# Patient Record
Sex: Male | Born: 1935 | Race: Black or African American | Hispanic: No | Marital: Married | Smoking: Never smoker
Health system: Southern US, Community
[De-identification: ages and names within clinical notes are randomized; demographics above are authoritative.]

## PROBLEM LIST (undated history)

## (undated) DIAGNOSIS — I4891 Unspecified atrial fibrillation: Secondary | ICD-10-CM

## (undated) DIAGNOSIS — I10 Essential (primary) hypertension: Secondary | ICD-10-CM

## (undated) HISTORY — PX: SHOULDER SURGERY: SHX246

---

## 2017-06-23 ENCOUNTER — Encounter (HOSPITAL_BASED_OUTPATIENT_CLINIC_OR_DEPARTMENT_OTHER): Payer: Self-pay | Admitting: *Deleted

## 2017-06-23 ENCOUNTER — Emergency Department (HOSPITAL_BASED_OUTPATIENT_CLINIC_OR_DEPARTMENT_OTHER): Payer: Medicare Other

## 2017-06-23 ENCOUNTER — Observation Stay (HOSPITAL_BASED_OUTPATIENT_CLINIC_OR_DEPARTMENT_OTHER)
Admission: EM | Admit: 2017-06-23 | Discharge: 2017-06-24 | Disposition: A | Discharge status: Home / Self Care | Payer: Medicare Other | Attending: Internal Medicine | Admitting: Internal Medicine

## 2017-06-23 DIAGNOSIS — Z7901 Long term (current) use of anticoagulants: Secondary | ICD-10-CM | POA: Insufficient documentation

## 2017-06-23 DIAGNOSIS — I482 Chronic atrial fibrillation: Secondary | ICD-10-CM | POA: Insufficient documentation

## 2017-06-23 DIAGNOSIS — I10 Essential (primary) hypertension: Secondary | ICD-10-CM | POA: Diagnosis not present

## 2017-06-23 DIAGNOSIS — R001 Bradycardia, unspecified: Secondary | ICD-10-CM | POA: Diagnosis present

## 2017-06-23 DIAGNOSIS — Z79899 Other long term (current) drug therapy: Secondary | ICD-10-CM | POA: Diagnosis not present

## 2017-06-23 DIAGNOSIS — Z9889 Other specified postprocedural states: Secondary | ICD-10-CM | POA: Diagnosis not present

## 2017-06-23 HISTORY — DX: Essential (primary) hypertension: I10

## 2017-06-23 HISTORY — DX: Unspecified atrial fibrillation: I48.91

## 2017-06-23 LAB — COMPREHENSIVE METABOLIC PANEL
ALBUMIN: 3.4 g/dL — AB (ref 3.5–5.0)
ALK PHOS: 63 U/L (ref 38–126)
ALT: 15 U/L — ABNORMAL LOW (ref 17–63)
AST: 19 U/L (ref 15–41)
Anion gap: 10 (ref 5–15)
BUN: 28 mg/dL — ABNORMAL HIGH (ref 6–20)
CHLORIDE: 108 mmol/L (ref 101–111)
CO2: 22 mmol/L (ref 22–32)
Calcium: 8.4 mg/dL — ABNORMAL LOW (ref 8.9–10.3)
Creatinine, Ser: 1.72 mg/dL — ABNORMAL HIGH (ref 0.61–1.24)
GFR calc non Af Amer: 34 mL/min — ABNORMAL LOW (ref >60)
GFR, EST AFRICAN AMERICAN: 40 mL/min — AB (ref >60)
GLUCOSE: 103 mg/dL — AB (ref 65–99)
POTASSIUM: 4.3 mmol/L (ref 3.5–5.1)
SODIUM: 140 mmol/L (ref 135–145)
Total Bilirubin: 0.5 mg/dL (ref 0.3–1.2)
Total Protein: 6.4 g/dL — ABNORMAL LOW (ref 6.5–8.1)

## 2017-06-23 LAB — CBC WITH DIFFERENTIAL/PLATELET
BASOS PCT: 0 %
Basophils Absolute: 0 10*3/uL (ref 0.0–0.1)
EOS ABS: 0.2 10*3/uL (ref 0.0–0.7)
EOS PCT: 5 %
HCT: 34.9 % — ABNORMAL LOW (ref 39.0–52.0)
HEMOGLOBIN: 11.8 g/dL — AB (ref 13.0–17.0)
LYMPHS ABS: 1.4 10*3/uL (ref 0.7–4.0)
Lymphocytes Relative: 32 %
MCH: 29 pg (ref 26.0–34.0)
MCHC: 33.8 g/dL (ref 30.0–36.0)
MCV: 85.7 fL (ref 78.0–100.0)
MONOS PCT: 14 %
Monocytes Absolute: 0.6 10*3/uL (ref 0.1–1.0)
NEUTROS PCT: 49 %
Neutro Abs: 2.2 10*3/uL (ref 1.7–7.7)
PLATELETS: 191 10*3/uL (ref 150–400)
RBC: 4.07 MIL/uL — AB (ref 4.22–5.81)
RDW: 16.1 % — ABNORMAL HIGH (ref 11.5–15.5)
WBC: 4.5 10*3/uL (ref 4.0–10.5)

## 2017-06-23 LAB — BRAIN NATRIURETIC PEPTIDE: B Natriuretic Peptide: 100.3 pg/mL — ABNORMAL HIGH (ref 0.0–100.0)

## 2017-06-23 LAB — TROPONIN I

## 2017-06-23 LAB — PROTIME-INR
INR: 1.85
PROTHROMBIN TIME: 21.6 s — AB (ref 11.4–15.2)

## 2017-06-23 NOTE — ED Notes (Signed)
ED Provider at bedside. 

## 2017-06-23 NOTE — ED Provider Notes (Signed)
[facility] Provider Note   CSN: [id number] Arrival date & time: 06/23/17  1658     History   Chief Complaint Chief Complaint  Patient presents with  . Leg Swelling    HPI Peter Patel is a 83 y.o. male.  HPI   Patient is a 83 year old male who is on a road trip.  Patient has been on a road trip with his wife for the last 3 months.Hhe's been developing lower extremity swelling. When they were in [place] that went to Hospital and had an x-ray done and then was discharged. He reports the swelling got a little bit worse. Bilateral lower extremities. Went to urgent care today and was sent here for further evaluation. Urgent care also noted that he had a low heart rate. Patient does not have any dizziness shortness of breath chest pain or other complaints.  Past Medical History:  Diagnosis Date  . Atrial fibrillation (HCC)   . Hypertension     There are no active problems to display for this patient.   Past Surgical History:  Procedure Laterality Date  . SHOULDER SURGERY         Home Medications    Prior to Admission medications   Medication Sig Start Date End Date Taking? Authorizing Provider  AMIODARONE HCL PO Take by mouth.   Yes [provider]  amLODipine (NORVASC) 10 MG tablet Take 10 mg by mouth daily.   Yes [provider]  FINASTERIDE PO Take by mouth.   Yes [provider]  LISINOPRIL PO Take by mouth.   Yes [provider]  Metoprolol-Hydrochlorothiazide (METOPROLOL-HCTZ ER PO) Take by mouth.   Yes [provider]  RANITIDINE HCL PO Take by mouth.   Yes [provider]  warfarin (COUMADIN) 2 MG tablet Take 2 mg by mouth daily.   Yes [provider]    Family History No family history on file.  Social History Social History  Substance Use Topics  . Smoking status: Never Smoker  . Smokeless tobacco: Never Used  . Alcohol use No     Allergies   Patient has no allergy  information on record.   Review of Systems Review of Systems  Constitutional: Negative for activity change and fever.  HENT: Negative for sore throat.   Eyes: Negative for visual disturbance.  Respiratory: Negative for cough and shortness of breath.   Cardiovascular: Negative for chest pain.  Gastrointestinal: Negative for diarrhea, nausea and vomiting.  Genitourinary: Negative for dysuria and frequency.  Musculoskeletal: Negative for back pain.  Skin: Negative for rash.  Allergic/Immunologic: Negative for immunocompromised state.  Neurological: Negative for dizziness and light-headedness.  Psychiatric/Behavioral: Negative for confusion.  All other systems reviewed and are negative.    Physical Exam Updated Vital Signs BP (!) 156/79 (BP Location: Left Arm)   Pulse (!) 52   Temp 97.9 F (36.6 C) (Oral)   Resp 17   Ht 5' 5.75" (1.67 m)   Wt 67.1 kg (148 lb)   SpO2 99%   BMI 24.07 kg/m   Physical Exam  Constitutional: He is oriented to person, place, and time. He appears well-nourished.  HENT:  Head: Normocephalic.  Eyes: Conjunctivae are normal.  Cardiovascular: Normal rate.   Pulmonary/Chest: Effort normal.  Musculoskeletal:  +2 pitting edema bilateral lower extremities.  Neurological: He is oriented to person, place, and time.  Skin: Skin is warm and dry. He is not diaphoretic.  Psychiatric: He has a normal mood and affect. His  behavior is normal.     ED Treatments / Results  Labs (all labs ordered are listed, but only abnormal results are displayed) Labs Reviewed  CBC WITH DIFFERENTIAL/PLATELET - Abnormal; Notable for the following:       Result Value   RBC 4.07 (*)    Hemoglobin 11.8 (*)    HCT 34.9 (*)    RDW 16.1 (*)    All other components within normal limits  COMPREHENSIVE METABOLIC PANEL - Abnormal; Notable for the following:    Glucose, Bld 103 (*)    BUN 28 (*)    Creatinine, Ser 1.72 (*)    Calcium 8.4 (*)    Total Protein 6.4 (*)     Albumin 3.4 (*)    ALT 15 (*)    GFR calc non Af Amer 34 (*)    GFR calc Af Amer 40 (*)    All other components within normal limits  BRAIN NATRIURETIC PEPTIDE - Abnormal; Notable for the following:    B Natriuretic Peptide 100.3 (*)    All other components within normal limits  PROTIME-INR - Abnormal; Notable for the following:    Prothrombin Time 21.6 (*)    All other components within normal limits  TROPONIN I    EKG  EKG Interpretation  Date/Time:  Friday June 23 2017 17:31:22 EDT Ventricular Rate:  51 PR Interval:    QRS Duration: 133 QT Interval:  528 QTC Calculation: 487 R Axis:   -59 Text Interpretation:  Ectopic atrial rhythm Left bundle branch block slow rythym Confirmed by Corlis LeakMackuen, Ashlyn Cabler ([id number]) on 06/23/2017 6:30:50 PM       Radiology Dg Chest 2 View  Result Date: 06/23/2017 CLINICAL DATA:  Intermittent lightheadedness EXAM: CHEST  2 VIEW COMPARISON:  None. FINDINGS: No focal pulmonary infiltrate or effusion. Heart size upper normal. No pneumothorax. Degenerative osteophytes of the spine. IMPRESSION: No active cardiopulmonary disease. Electronically Signed   By: Jasmine PangKim  Fujinaga M.D.   On: 06/23/2017 19:47   [place] Venous Img Lower Bilateral  Result Date: 06/23/2017 CLINICAL DATA:  Bilateral lower extremity edema for 3 days EXAM: BILATERAL LOWER EXTREMITY VENOUS DOPPLER ULTRASOUND TECHNIQUE: Gray-scale sonography with graded compression, as well as color Doppler and duplex ultrasound were performed to evaluate the lower extremity deep venous systems from the level of the common femoral vein and including the common femoral, femoral, profunda femoral, popliteal and calf veins including the posterior tibial, peroneal and gastrocnemius veins when visible. The superficial great saphenous vein was also interrogated. Spectral Doppler was utilized to evaluate flow at rest and with distal augmentation maneuvers in the common femoral, femoral and popliteal veins. COMPARISON:  None.  FINDINGS: RIGHT LOWER EXTREMITY Common Femoral Vein: No evidence of thrombus. Normal compressibility, respiratory phasicity and response to augmentation. Saphenofemoral Junction: No evidence of thrombus. Normal compressibility and flow on color Doppler imaging. Profunda Femoral Vein: No evidence of thrombus. Normal compressibility and flow on color Doppler imaging. Femoral Vein: No evidence of thrombus. Normal compressibility, respiratory phasicity and response to augmentation. Popliteal Vein: No evidence of thrombus. Normal compressibility, respiratory phasicity and response to augmentation. Calf Veins: No evidence of thrombus. Normal compressibility and flow on color Doppler imaging. Superficial Great Saphenous Vein: No evidence of thrombus. Normal compressibility and flow on color Doppler imaging. Other Findings: Slightly complex fluid collection in the right popliteal fossa measuring 4 x 2.1 x 3.5 cm LEFT LOWER EXTREMITY Common Femoral Vein: No evidence of thrombus. Normal compressibility, respiratory phasicity and response to augmentation. Saphenofemoral Junction:  No evidence of thrombus. Normal compressibility and flow on color Doppler imaging. Profunda Femoral Vein: No evidence of thrombus. Normal compressibility and flow on color Doppler imaging. Femoral Vein: No evidence of thrombus. Normal compressibility, respiratory phasicity and response to augmentation. Popliteal Vein: No evidence of thrombus. Normal compressibility, respiratory phasicity and response to augmentation. Calf Veins: No evidence of thrombus. Normal compressibility and flow on color Doppler imaging. Superficial Great Saphenous Vein: No evidence of thrombus. Normal compressibility and flow on color Doppler imaging. Other Findings:  None. IMPRESSION: No evidence of DVT within either lower extremity. 4 cm slightly complex fluid collection in the popliteal fossa suggesting popliteal fossa cyst containing hemorrhage or debris. Electronically  Signed   By: Jasmine Pang M.D.   On: 06/23/2017 19:39    Procedures Procedures (including critical care time)  Medications Ordered in ED Medications - No data to display   Initial Impression / Assessment and Plan / ED Course  I have reviewed the triage vital signs and the nursing notes.  Pertinent labs & imaging results that were available during my care of the patient were reviewed by me and considered in my medical decision making (see chart for details).    Patient is an 83 year old male presenting with leg swelling during this [address] trip. Patient are evaluated with a chest x-ray in [place]. However this is month ago he's continued to have leg swelling. She has no shortness breath or chest pain. Patient did have low heart rate. However he is not having any symptoms. Patient's past medical history is significant for cardiac disease, A. fib, on Coumadin  Of note patient is on 40 mg of metoprolol ER daily.  8:49 PM DVT study is negative. BNP is elevated. Troponin negative. Given his swelling and elevated BNP I think patient likely has new-onset heart failure. I'm concerned also for patient's slow A. fib. Patient's heart rate is between 44 and 52. I would like to bring in for observation with stopping metoprolol to make sure patient's heart rate improves and get an echo to make sure patient is not in new failure needing medication changes.   Final Clinical Impressions(s) / ED Diagnoses   Final diagnoses:  None    New Prescriptions New Prescriptions   No medications on file     Abelino Derrick, MD 06/23/17 2049

## 2017-06-23 NOTE — ED Triage Notes (Signed)
His feet and legs have been swelling x 3 weeks.

## 2017-06-23 NOTE — ED Notes (Signed)
Patient denies pain and is resting comfortably.  

## 2017-06-24 ENCOUNTER — Encounter (HOSPITAL_COMMUNITY): Payer: Self-pay | Admitting: *Deleted

## 2017-06-24 DIAGNOSIS — R001 Bradycardia, unspecified: Secondary | ICD-10-CM | POA: Diagnosis not present

## 2017-06-24 LAB — CBC
HEMATOCRIT: 35.3 % — AB (ref 39.0–52.0)
HEMOGLOBIN: 11.7 g/dL — AB (ref 13.0–17.0)
MCH: 28.2 pg (ref 26.0–34.0)
MCHC: 33.1 g/dL (ref 30.0–36.0)
MCV: 85.1 fL (ref 78.0–100.0)
Platelets: 206 10*3/uL (ref 150–400)
RBC: 4.15 MIL/uL — ABNORMAL LOW (ref 4.22–5.81)
RDW: 16.2 % — ABNORMAL HIGH (ref 11.5–15.5)
WBC: 4 10*3/uL (ref 4.0–10.5)

## 2017-06-24 LAB — BASIC METABOLIC PANEL
ANION GAP: 7 (ref 5–15)
BUN: 21 mg/dL — ABNORMAL HIGH (ref 6–20)
CALCIUM: 8.7 mg/dL — AB (ref 8.9–10.3)
CHLORIDE: 110 mmol/L (ref 101–111)
CO2: 21 mmol/L — AB (ref 22–32)
Creatinine, Ser: 1.49 mg/dL — ABNORMAL HIGH (ref 0.61–1.24)
GFR calc non Af Amer: 41 mL/min — ABNORMAL LOW (ref >60)
GFR, EST AFRICAN AMERICAN: 48 mL/min — AB (ref >60)
GLUCOSE: 131 mg/dL — AB (ref 65–99)
POTASSIUM: 3.8 mmol/L (ref 3.5–5.1)
Sodium: 138 mmol/L (ref 135–145)

## 2017-06-24 LAB — TSH: TSH: 1.842 u[IU]/mL (ref 0.350–4.500)

## 2017-06-24 MED ORDER — DARIFENACIN HYDROBROMIDE ER 7.5 MG PO TB24
7.5000 mg | ORAL_TABLET | Freq: Every day | ORAL | Status: DC
  Administered 2017-06-24: 7.5 mg via ORAL
  Filled 2017-06-24: qty 1

## 2017-06-24 MED ORDER — FINASTERIDE 5 MG PO TABS
5.0000 mg | ORAL_TABLET | Freq: Every day | ORAL | Status: DC
  Administered 2017-06-24: 5 mg via ORAL
  Filled 2017-06-24: qty 1

## 2017-06-24 MED ORDER — WARFARIN - PHARMACIST DOSING INPATIENT: Freq: Every day | Status: DC

## 2017-06-24 MED ORDER — WARFARIN SODIUM 2 MG PO TABS: 1.0000 mg | ORAL_TABLET | Freq: Once | ORAL | Status: DC

## 2017-06-24 MED ORDER — FAMOTIDINE 20 MG PO TABS: 20.0000 mg | ORAL_TABLET | Freq: Two times a day (BID) | ORAL | Status: DC

## 2017-06-24 MED ORDER — ACETAMINOPHEN 325 MG PO TABS: 650.0000 mg | ORAL_TABLET | Freq: Four times a day (QID) | ORAL | Status: DC | PRN

## 2017-06-24 MED ORDER — AMLODIPINE BESYLATE 10 MG PO TABS
10.0000 mg | ORAL_TABLET | Freq: Every day | ORAL | Status: DC
  Administered 2017-06-24: 10 mg via ORAL
  Filled 2017-06-24: qty 1

## 2017-06-24 MED ORDER — AMIODARONE HCL 200 MG PO TABS
200.0000 mg | ORAL_TABLET | Freq: Every day | ORAL | Status: DC
  Filled 2017-06-24: qty 1

## 2017-06-24 MED ORDER — ONDANSETRON HCL 4 MG/2ML IJ SOLN: 4.0000 mg | Freq: Four times a day (QID) | INTRAMUSCULAR | Status: DC | PRN

## 2017-06-24 MED ORDER — ONDANSETRON HCL 4 MG PO TABS: 4.0000 mg | ORAL_TABLET | Freq: Four times a day (QID) | ORAL | Status: DC | PRN

## 2017-06-24 MED ORDER — FAMOTIDINE 20 MG PO TABS
20.0000 mg | ORAL_TABLET | Freq: Two times a day (BID) | ORAL | Status: DC
  Administered 2017-06-24: 20 mg via ORAL
  Filled 2017-06-24: qty 1

## 2017-06-24 MED ORDER — LISINOPRIL 40 MG PO TABS
40.0000 mg | ORAL_TABLET | Freq: Every day | ORAL | Status: DC
  Administered 2017-06-24: 40 mg via ORAL
  Filled 2017-06-24: qty 1

## 2017-06-24 MED ORDER — ACETAMINOPHEN 650 MG RE SUPP: 650.0000 mg | Freq: Four times a day (QID) | RECTAL | Status: DC | PRN

## 2017-06-24 MED ORDER — WARFARIN SODIUM 3 MG PO TABS
6.0000 mg | ORAL_TABLET | Freq: Once | ORAL | Status: AC
  Administered 2017-06-24: 6 mg via ORAL
  Filled 2017-06-24: qty 2

## 2017-06-24 NOTE — Progress Notes (Signed)
Reviewed discharge instructions with patient, wife and niece.  Went over medication changes.  Patient and wife verbalized understanding.  Patient ambulated to front of hospital to be taken home by wife and niece.

## 2017-06-24 NOTE — Discharge Instructions (Signed)
Bradycardia, Adult °Bradycardia is a slower-than-normal heartbeat. A normal resting heart rate for an adult ranges from 60 to 100 beats per minute. With bradycardia, the resting heart rate is less than 60 beats per minute. °Bradycardia can prevent enough oxygen from reaching certain areas of your body when you are active. It can be serious if it keeps enough oxygen from reaching your brain and other parts of your body. Bradycardia is not a problem for everyone. For some healthy adults, a slow resting heart rate is normal. °What are the causes? °This condition may be caused by: °· A problem with the heart, including: °? A problem with the heart's electrical system, such as a heart block. °? A problem with the heart's natural pacemaker (sinus node). °? Heart disease. °? A heart attack. °? Heart damage. °? A heart infection. °? A heart condition that is present at birth (congenital heart defect). °· Certain medicines that treat heart conditions. °· Certain conditions, such as hypothyroidism and obstructive sleep apnea. °· Problems with the balance of chemicals and other substances, like potassium, in the blood. ° °What increases the risk? °This condition is more likely to develop in adults who: °· Are age 65 or older. °· Have high blood pressure (hypertension), high cholesterol (hyperlipidemia), or diabetes. °· Drink heavily, use tobacco or nicotine products, or use drugs. °· Are stressed. ° °What are the signs or symptoms? °Symptoms of this condition include: °· Light-headedness. °· Feeling faint or fainting. °· Fatigue and weakness. °· Shortness of breath. °· Chest pain (angina). °· Drowsiness. °· Confusion. °· Dizziness. ° °How is this diagnosed? °This condition may be diagnosed based on: °· Your symptoms. °· Your medical history. °· A physical exam. ° °During the exam, your health care provider will listen to your heartbeat and check your pulse. To confirm the diagnosis, your health care provider may order tests,  such as: °· Blood tests. °· An electrocardiogram (ECG). This test records the heart's electrical activity. The test can show how fast your heart is beating and whether the heartbeat is steady. °· A test in which you wear a portable device (event recorder or Holter monitor) to record your heart's electrical activity while you go about your day. °· An exercise test. ° °How is this treated? °Treatment for this condition depends on the cause of the condition and how severe your symptoms are. Treatment may involve: °· Treatment of the underlying condition. °· Changing your medicines or how much medicine you take. °· Having a small, battery-operated device called a pacemaker implanted under the skin. When bradycardia occurs, this device can be used to increase your heart rate and help your heart to beat in a regular rhythm. ° °Follow these instructions at home: °Lifestyle ° °· Manage any health conditions that contribute to bradycardia as told by your health care provider. °· Follow a heart-healthy diet. A nutrition specialist (dietitian) can help to educate you about healthy food options and changes. °· Follow an exercise program that is approved by your health care provider. °· Maintain a healthy weight. °· Try to reduce or manage your stress, such as with yoga or meditation. If you need help reducing stress, ask your health care provider. °· Do not use use any products that contain nicotine or tobacco, such as cigarettes and e-cigarettes. If you need help quitting, ask your health care provider. °· Do not use illegal drugs. °· Limit alcohol intake to no more than 1 drink per day for nonpregnant women and 2 drinks per   day for men. One drink equals 12 oz of beer, 5 oz of wine, or 1½ oz of hard liquor. °General instructions °· Take over-the-counter and prescription medicines only as told by your health care provider. °· Keep all follow-up visits as directed by your health care provider. This is important. °How is this  prevented? °In some cases, bradycardia may be prevented by: °· Treating underlying medical problems. °· Stopping behaviors or medicines that can trigger the condition. ° °Contact a health care provider if: °· You feel light-headed or dizzy. °· You almost faint. °· You feel weak or are easily fatigued during physical activity. °· You experience confusion or have memory problems. °Get help right away if: °· You faint. °· You have an irregular heartbeat (palpitations). °· You have chest pain. °· You have trouble breathing. °This information is not intended to replace advice given to you by your health care provider. Make sure you discuss any questions you have with your health care provider. °Document Released: 08/20/2002 Document Revised: 07/26/2016 Document Reviewed: 05/19/2016 °Elsevier Interactive Patient Education © 2017 [employer] ° °

## 2017-06-24 NOTE — ED Notes (Addendum)
Attempted to call report [facility]nd time to [facility] Rm 3E[facility]; nurse unavailable and pt just left with [facility]; callback number left with secretary. ED [facility] charge nurse notified.

## 2017-06-24 NOTE — ED Notes (Signed)
Attempted to called report to [facility] Rm 3E[facility]; nurse unavailable. Left callback number and will attempt again when [facility] arrives.

## 2017-06-24 NOTE — Progress Notes (Signed)
Patient ambulated in hallway up to nurses station and back independently with RN as standby.  Patient denied any chest pain, shortness of breath or other complaint.

## 2017-06-24 NOTE — ED Notes (Addendum)
Report given to Marily MemosEdna, Charity fundraiserN at [facility].

## 2017-06-24 NOTE — H&P (Signed)
History and Physical    Peter StandingClarence Patel MVH:[id number] DOB: [birth year] DOA: 06/23/2017  PCP: System, Pcp Not In  Patient coming from: Home  I have personally briefly reviewed patient's old medical records [facility]  Chief Complaint: Leg swelling  HPI: Peter Patel is a 83 y.o. male with medical history significant of A.Fib and HTN.  Patient presented to his PCPs office today for c/o leg swelling that has been ongoing for several months.  No CP, no SOB.  Persistent symptoms.  [facility] and told to elevate his legs.  While at PCPs office he was noted to be in A.Fib (which is chronic) but was bradycardic to 40s-50s.  He got sent to ED.   ED Course: BNP 100.3.  CXR neg.  EDP didn't feel comfortable just decreasing his metoprolol so sent him for obs.   Review of Systems: As per HPI otherwise 10 point review of systems negative.   Past Medical History:  Diagnosis Date  . Atrial fibrillation (HCC)   . Hypertension     Past Surgical History:  Procedure Laterality Date  . SHOULDER SURGERY       reports that he has never smoked. He has never used smokeless tobacco. He reports that he does not drink alcohol or use drugs.  Not on File  No family history on file.   Prior to Admission medications   Medication Sig Start Date End Date Taking? Authorizing Provider  AMIODARONE HCL PO Take by mouth.   Yes [provider]  amLODipine (NORVASC) 10 MG tablet Take 10 mg by mouth daily.   Yes [provider]  FINASTERIDE PO Take by mouth.   Yes [provider]  LISINOPRIL PO Take by mouth.   Yes [provider]  Metoprolol-Hydrochlorothiazide (METOPROLOL-HCTZ ER PO) Take by mouth.   Yes [provider]  RANITIDINE HCL PO Take by mouth.   Yes [provider]  warfarin (COUMADIN) 2 MG tablet Take 2 mg by mouth daily.   Yes [provider]    Physical Exam: Vitals:   06/23/17 2330 06/24/17 0000 06/24/17  0030 06/24/17 0132  BP: (!) 146/100 (!) 146/73 (!) 156/75 (!) 161/78  Pulse: (!) 51   (!) 50  Resp: 14 (!) 22 14 16   Temp:    (!) 97.4 F (36.3 C)  TempSrc:    Oral  SpO2: 97%   100%  Weight:    63.5 kg (140 lb)  Height:    5\' 5"  (1.651 m)    Constitutional: NAD, calm, comfortable Eyes: PERRL, lids and conjunctivae normal ENMT: Mucous membranes are moist. Posterior pharynx clear of any exudate or lesions.Normal dentition.  Neck: normal, supple, no masses, no thyromegaly Respiratory: clear to auscultation bilaterally, no wheezing, no crackles. Normal respiratory effort. No accessory muscle use.  Cardiovascular: Regular rate and rhythm, no murmurs / rubs / gallops. No extremity edema. 2+ pedal pulses. No carotid bruits.  Abdomen: no tenderness, no masses palpated. No hepatosplenomegaly. Bowel sounds positive.  Musculoskeletal: no clubbing / cyanosis. No joint deformity upper and lower extremities. Good ROM, no contractures. Normal muscle tone.  Skin: no rashes, lesions, ulcers. No induration Neurologic: CN 2-12 grossly intact. Sensation intact, DTR normal. Strength 5/5 in all 4.  Psychiatric: Normal judgment and insight. Alert and oriented x 3. Normal mood.    Labs on Admission: I have personally reviewed following labs and imaging studies  CBC:  Recent Labs Lab 06/23/17 1839  WBC 4.5  NEUTROABS  2.2  HGB 11.8*  HCT 34.9*  MCV 85.7  PLT 191   Basic Metabolic Panel:  Recent Labs Lab 06/23/17 1839  NA 140  K 4.3  CL 108  CO2 22  GLUCOSE 103*  BUN 28*  CREATININE 1.72*  CALCIUM 8.4*   GFR: Estimated Creatinine Clearance: 27.3 mL/min (A) (by C-G formula based on SCr of 1.72 mg/dL (H)). Liver Function Tests:  Recent Labs Lab 06/23/17 1839  AST 19  ALT 15*  ALKPHOS 63  BILITOT 0.5  PROT 6.4*  ALBUMIN 3.4*   No results for input(s): LIPASE, AMYLASE in the last 168 hours. No results for input(s): AMMONIA in the last 168 hours. Coagulation Profile:  Recent  Labs Lab 06/23/17 1839  INR 1.85   Cardiac Enzymes:  Recent Labs Lab 06/23/17 1839  TROPONINI <0.03   BNP (last 3 results) No results for input(s): PROBNP in the last 8760 hours. HbA1C: No results for input(s): HGBA1C in the last 72 hours. CBG: No results for input(s): GLUCAP in the last 168 hours. Lipid Profile: No results for input(s): CHOL, HDL, LDLCALC, TRIG, CHOLHDL, LDLDIRECT in the last 72 hours. Thyroid Function Tests: No results for input(s): TSH, T4TOTAL, FREET4, T3FREE, THYROIDAB in the last 72 hours. Anemia Panel: No results for input(s): VITAMINB12, FOLATE, FERRITIN, TIBC, IRON, RETICCTPCT in the last 72 hours. Urine analysis: No results found for: COLORURINE, APPEARANCEUR, LABSPEC, PHURINE, GLUCOSEU, HGBUR, BILIRUBINUR, KETONESUR, PROTEINUR, UROBILINOGEN, NITRITE, LEUKOCYTESUR  Radiological Exams on Admission: Dg Chest 2 View  Result Date: 06/23/2017 CLINICAL DATA:  Intermittent lightheadedness EXAM: CHEST  2 VIEW COMPARISON:  None. FINDINGS: No focal pulmonary infiltrate or effusion. Heart size upper normal. No pneumothorax. Degenerative osteophytes of the spine. IMPRESSION: No active cardiopulmonary disease. Electronically Signed   By: Jasmine Pang M.D.   On: 06/23/2017 19:47   US Venous Img Lower Bilateral  Result Date: 06/23/2017 CLINICAL DATA:  Bilateral lower extremity edema for 3 days EXAM: BILATERAL LOWER EXTREMITY VENOUS DOPPLER ULTRASOUND TECHNIQUE: Gray-scale sonography with graded compression, as well as color Doppler and duplex ultrasound were performed to evaluate the lower extremity deep venous systems from the level of the common femoral vein and including the common femoral, femoral, profunda femoral, popliteal and calf veins including the posterior tibial, peroneal and gastrocnemius veins when visible. The superficial great saphenous vein was also interrogated. Spectral Doppler was utilized to evaluate flow at rest and with distal augmentation  maneuvers in the common femoral, femoral and popliteal veins. COMPARISON:  None. FINDINGS: RIGHT LOWER EXTREMITY Common Femoral Vein: No evidence of thrombus. Normal compressibility, respiratory phasicity and response to augmentation. Saphenofemoral Junction: No evidence of thrombus. Normal compressibility and flow on color Doppler imaging. Profunda Femoral Vein: No evidence of thrombus. Normal compressibility and flow on color Doppler imaging. Femoral Vein: No evidence of thrombus. Normal compressibility, respiratory phasicity and response to augmentation. Popliteal Vein: No evidence of thrombus. Normal compressibility, respiratory phasicity and response to augmentation. Calf Veins: No evidence of thrombus. Normal compressibility and flow on color Doppler imaging. Superficial Great Saphenous Vein: No evidence of thrombus. Normal compressibility and flow on color Doppler imaging. Other Findings: Slightly complex fluid collection in the right popliteal fossa measuring 4 x 2.1 x 3.5 cm LEFT LOWER EXTREMITY Common Femoral Vein: No evidence of thrombus. Normal compressibility, respiratory phasicity and response to augmentation. Saphenofemoral Junction: No evidence of thrombus. Normal compressibility and flow on color Doppler imaging. Profunda Femoral Vein: No evidence of thrombus. Normal compressibility and flow on color Doppler imaging. Femoral  Vein: No evidence of thrombus. Normal compressibility, respiratory phasicity and response to augmentation. Popliteal Vein: No evidence of thrombus. Normal compressibility, respiratory phasicity and response to augmentation. Calf Veins: No evidence of thrombus. Normal compressibility and flow on color Doppler imaging. Superficial Great Saphenous Vein: No evidence of thrombus. Normal compressibility and flow on color Doppler imaging. Other Findings:  None. IMPRESSION: No evidence of DVT within either lower extremity. 4 cm slightly complex fluid collection in the popliteal fossa  suggesting popliteal fossa cyst containing hemorrhage or debris. Electronically Signed   By: Jasmine Pang M.D.   On: 06/23/2017 19:39    EKG: Independently reviewed.  Assessment/Plan Active Problems:   Bradycardia    1. Bradycardia - 1. Decrease dose of metoprolol once we know what it is when he calls wife in AM 2. HTN - Continue other home meds  DVT prophylaxis: Coumadin Code Status: Full Family Communication: No family in room Disposition Plan: Home later today Consults called: None Admission status: Place in [place], [place]. DO [facility] Pager [phone]  If 7AM-7PM, please contact day team taking care of patient [email or web address] Password [email or web address]  06/24/2017, 3:14 AM

## 2017-06-24 NOTE — Progress Notes (Signed)
ANTICOAGULATION CONSULT NOTE - Initial Consult  Pharmacy Consult for Coumadin Indication: atrial fibrillation  Allergies  Allergen Reactions  . Other Swelling    Pt had a reaction to a medication that he was taking for fluid retention. He does not know the name of the medication.     Patient Measurements: Height: 5\' 5"  (165.1 cm) Weight: 140 lb (63.5 kg) IBW/kg (Calculated) : 61.5  Assessment: 83 yo M presents on 7/13 with leg swelling during a 3 month road trip. Patient received warfarin 6mg  at approximately 0900 this morning. INR last night in ED subtherapeutic 1.85, HgB 11.7, PLT WNL. Will order low dose warfarin tonight so that patient is back on standard warfarin dosing schedule.   Home warfarin: 4 mg on Sunday, Tuesday, Thursday Saturday. 2 mg all other days.  Goal of Therapy:  INR 2-3 Monitor platelets by anticoagulation protocol: Yes   Plan:  Warfarin 1mg  PO x1 tonight Monitor daily INR, CBC, s/s of bleed  Ruben Imony Faith Patricelli, PharmD Clinical Pharmacist 06/24/2017 12:12 PM

## 2017-06-24 NOTE — Progress Notes (Signed)
ANTICOAGULATION CONSULT NOTE - Initial Consult  Pharmacy Consult for Coumadin Indication: atrial fibrillation  Not on File  Patient Measurements: Height: 5\' 5"  (165.1 cm) Weight: 140 lb (63.5 kg) IBW/kg (Calculated) : 61.5  Assessment: 83 yo M presents on 7/13 with feel and leg swelling. On Coumadin 6mg  daily PTA for Afib. INR is slightly low at 1.85. Hgb 11.8, plts wnl.  Goal of Therapy:  INR 2-3 Monitor platelets by anticoagulation protocol: Yes   Plan:  Give Coumadin 6mg  PO daily, will give dose later this morning today Monitor daily INR, CBC, s/s of bleed  Enzo BiNathan Leianna Barga, PharmD, BCPS Clinical Pharmacist Pager [phone] 06/24/2017 2:40 AM

## 2017-06-24 NOTE — Discharge Summary (Signed)
[facility]  Physician Discharge Summary   Patient ID: Peter Patel MRN: [id number] DOB/AGE: 01-06-1930 83 y.o.  Admit date: 06/23/2017 Discharge date: 06/24/2017  PCP: At [place], [place]  DISCHARGE DIAGNOSES:  Active Problems:   Bradycardia   RECOMMENDATIONS FOR OUTPATIENT FOLLOW UP: 1. Patient instructed to follow-up providers at [place] and then at [place] which is his home town 2. He will need outpatient echocardiogram.   DISCHARGE CONDITION: good  Diet recommendation: As before  Filed Weights   06/23/17 1701 06/24/17 0132  Weight: 67.1 kg (148 lb) 63.5 kg (140 lb)    INITIAL HISTORY: 83 year old African-American male with a past medical history of atrial fibrillation on anticoagulation, hypertension, presented to urgent care with complaints of lower extremity edema that has been ongoing for several weeks but worse in the last 2-3 weeks. While he was at the urgent care center he was noted to have heart rate in the 40s. He was sent over to the emergency department.  Consultations:  None  Procedures:  None  HOSPITAL COURSE:   Bradycardia, possibly due to medications Patient noted to be on amiodarone and metoprolol at home. His metoprolol was held. Patient was never symptomatic due to the slow heart rate. Blood pressure remained stable. No symptoms suggestive of thyroid disease. Patient is originally from [place] and travels down to [place] every year. He then came to [place] to visit other family members. He does not know his usual heart rate. Have recommended that he stop his metoprolol for now. He may continue his amiodarone. He should have his vital signs checked by providers at [place] and then he should follow-up with his cardiologist in [place]. If heart rate has improved then a lower dose of metoprolol can be considered. This was discussed extensively with the patient and his wife. They're agreeable with this plan.  Bilateral lower  extremity edema. He took a road trip down from [place] several weeks ago. He had a lower extremity Doppler study which was negative for DVT. Small possible blood collection noted in the right popliteal area. He wears a knee brace, which could be causing that collection from trauma. He denies any pain. His edema has significantly improved overnight. BNP was only 100. Unlikely he has significant congestive heart failure. Edema could be due to venous insufficiency. Will not recommend any diuretics at this time. Compression stockings. He was asked to keep his leg elevated. He may benefit from an echocardiogram, but this can be pursued as outpatient. He will discuss this further with her providers in [place].  Blood work was repeated this morning and hemoglobin is stable. Creatinine is also stable to improved. Unknown if the patient has CKD.  He may continue anticoagulation for atrial fibrillation. INR to be checked at [facility]. His INR is subtherapeutic here.  Patient has been ambulating in the hallway without any difficulty. He is considered stable for discharge.    PERTINENT LABS:  The results of significant diagnostics from this hospitalization (including imaging, microbiology, ancillary and laboratory) are listed below for reference.      Labs: Basic Metabolic Panel:  Recent Labs Lab 06/23/17 1839 06/24/17 0910  NA 140 138  K 4.3 3.8  CL 108 110  CO2 22 21*  GLUCOSE 103* 131*  BUN 28* 21*  CREATININE 1.72* 1.49*  CALCIUM 8.4* 8.7*   Liver Function Tests:  Recent Labs Lab 06/23/17 1839  AST 19  ALT 15*  ALKPHOS 63  BILITOT 0.5  PROT 6.4*  ALBUMIN 3.4*  CBC:  Recent Labs Lab 06/23/17 1839 06/24/17 0910  WBC 4.5 4.0  NEUTROABS 2.2  --   HGB 11.8* 11.7*  HCT 34.9* 35.3*  MCV 85.7 85.1  PLT 191 206   Cardiac Enzymes:  Recent Labs Lab 06/23/17 1839  TROPONINI <0.03   BNP: BNP (last 3 results)  Recent Labs  06/23/17 1839  BNP 100.3*    IMAGING  STUDIES Dg Chest 2 View  Result Date: 06/23/2017 CLINICAL DATA:  Intermittent lightheadedness EXAM: CHEST  2 VIEW COMPARISON:  None. FINDINGS: No focal pulmonary infiltrate or effusion. Heart size upper normal. No pneumothorax. Degenerative osteophytes of the spine. IMPRESSION: No active cardiopulmonary disease. Electronically Signed   By: Jasmine Pang M.D.   On: 06/23/2017 19:47   US Venous Img Lower Bilateral  Result Date: 06/23/2017 CLINICAL DATA:  Bilateral lower extremity edema for 3 days EXAM: BILATERAL LOWER EXTREMITY VENOUS DOPPLER ULTRASOUND TECHNIQUE: Gray-scale sonography with graded compression, as well as color Doppler and duplex ultrasound were performed to evaluate the lower extremity deep venous systems from the level of the common femoral vein and including the common femoral, femoral, profunda femoral, popliteal and calf veins including the posterior tibial, peroneal and gastrocnemius veins when visible. The superficial great saphenous vein was also interrogated. Spectral Doppler was utilized to evaluate flow at rest and with distal augmentation maneuvers in the common femoral, femoral and popliteal veins. COMPARISON:  None. FINDINGS: RIGHT LOWER EXTREMITY Common Femoral Vein: No evidence of thrombus. Normal compressibility, respiratory phasicity and response to augmentation. Saphenofemoral Junction: No evidence of thrombus. Normal compressibility and flow on color Doppler imaging. Profunda Femoral Vein: No evidence of thrombus. Normal compressibility and flow on color Doppler imaging. Femoral Vein: No evidence of thrombus. Normal compressibility, respiratory phasicity and response to augmentation. Popliteal Vein: No evidence of thrombus. Normal compressibility, respiratory phasicity and response to augmentation. Calf Veins: No evidence of thrombus. Normal compressibility and flow on color Doppler imaging. Superficial Great Saphenous Vein: No evidence of thrombus. Normal compressibility and  flow on color Doppler imaging. Other Findings: Slightly complex fluid collection in the right popliteal fossa measuring 4 x 2.1 x 3.5 cm LEFT LOWER EXTREMITY Common Femoral Vein: No evidence of thrombus. Normal compressibility, respiratory phasicity and response to augmentation. Saphenofemoral Junction: No evidence of thrombus. Normal compressibility and flow on color Doppler imaging. Profunda Femoral Vein: No evidence of thrombus. Normal compressibility and flow on color Doppler imaging. Femoral Vein: No evidence of thrombus. Normal compressibility, respiratory phasicity and response to augmentation. Popliteal Vein: No evidence of thrombus. Normal compressibility, respiratory phasicity and response to augmentation. Calf Veins: No evidence of thrombus. Normal compressibility and flow on color Doppler imaging. Superficial Great Saphenous Vein: No evidence of thrombus. Normal compressibility and flow on color Doppler imaging. Other Findings:  None. IMPRESSION: No evidence of DVT within either lower extremity. 4 cm slightly complex fluid collection in the popliteal fossa suggesting popliteal fossa cyst containing hemorrhage or debris. Electronically Signed   By: Jasmine Pang M.D.   On: 06/23/2017 19:39    DISCHARGE EXAMINATION: Vitals:   06/24/17 0132 06/24/17 0508 06/24/17 1028 06/24/17 1216  BP: (!) 161/78 140/65 127/63 138/67  Pulse: (!) 50 (!) 53 (!) 53 (!) 49  Resp: 16 16  18   Temp: (!) 97.4 F (36.3 C) 97.7 F (36.5 C)  98.1 F (36.7 C)  TempSrc: Oral Oral  Oral  SpO2: 100% 93% 99% 100%  Weight: 63.5 kg (140 lb)     Height: 5\' 5"  (1.651 m)  General appearance: alert, cooperative, appears stated age and no distress Resp: clear to auscultation bilaterally Cardio: S1, S2, is bradycardic, regular. No S3, S4. No rubs, or bruit. Systolic murmur appreciated over the precordium. GI: soft, non-tender; bowel sounds normal; no masses,  no organomegaly  DISPOSITION: Home  Discharge Instructions     Call MD for:  difficulty breathing, headache or visual disturbances    Complete by:  As directed    Call MD for:  extreme fatigue    Complete by:  As directed    Call MD for:  persistant dizziness or light-headedness    Complete by:  As directed    Call MD for:  persistant nausea and vomiting    Complete by:  As directed    Call MD for:  severe uncontrolled pain    Complete by:  As directed    Call MD for:  temperature >100.4    Complete by:  As directed    Diet - low sodium heart healthy    Complete by:  As directed    Discharge instructions    Complete by:  As directed    Please do not take your metoprolol for now. It is unclear if your slow heart rate is a new issue for you or if you always had a slow heart rate. Please follow-up with your providers in [place] in the next few days and if heart rate has improved, then a lower dose of metoprolol could be considered. You will also benefit from having an echocardiogram to do further workup for your leg swelling. This can be pursued in the next several weeks. Please have your PT/INR checked on Tuesday, July 17. [facility]. Please be sure to follow-up with your cardiologist in [place] when you return there.  You were cared for by a hospitalist during your hospital stay. If you have any questions about your discharge medications or the care you received while you were in the hospital after you are discharged, you can call the unit and asked to speak with the hospitalist on call if the hospitalist that took care of you is not available. Once you are discharged, your primary care physician will handle any further medical issues. Please note that NO REFILLS for any discharge medications will be authorized once you are discharged, as it is imperative that you return to your primary care physician (or establish a relationship with a primary care physician if you do not have one) for your aftercare needs so that  they can reassess your need for medications and monitor your lab values. If you do not have a primary care physician, you can call [phone] for a physician referral.   Increase activity slowly    Complete by:  As directed       ALLERGIES:  Allergies  Allergen Reactions  . Other Swelling    Pt had a reaction to a medication that he was taking for fluid retention. He does not know the name of the medication.      Discharge Medication List as of 06/24/2017  1:03 PM    CONTINUE these medications which have NOT CHANGED   Details  AMIODARONE HCL PO Take 200 mg by mouth daily. , Historical Med    amLODipine (NORVASC) 10 MG tablet Take 10 mg by mouth daily., Historical Med    FINASTERIDE PO Take 5 mg by mouth daily. , Historical Med    LISINOPRIL PO Take 40 mg by  mouth daily. , Historical Med    RANITIDINE HCL PO Take by mouth., Historical Med    warfarin (COUMADIN) 2 MG tablet Take by mouth daily. 4 mg on Sunday, Tuesday, Thursday Saturday. 2 mg all other days., Historical Med      STOP taking these medications     metoprolol tartrate (LOPRESSOR) 50 MG tablet           TOTAL DISCHARGE TIME: 35 mins  [facility]  [facility] Pager [phone]  06/24/2017, 3:15 PM

## 2018-07-14 IMAGING — CR DG CHEST 2V
2 series · 2 of 2 positions shown · non-contrast
Comparison: None.

CLINICAL DATA: Intermittent lightheadedness

EXAM:
CHEST  2 VIEW

[w chest pa]
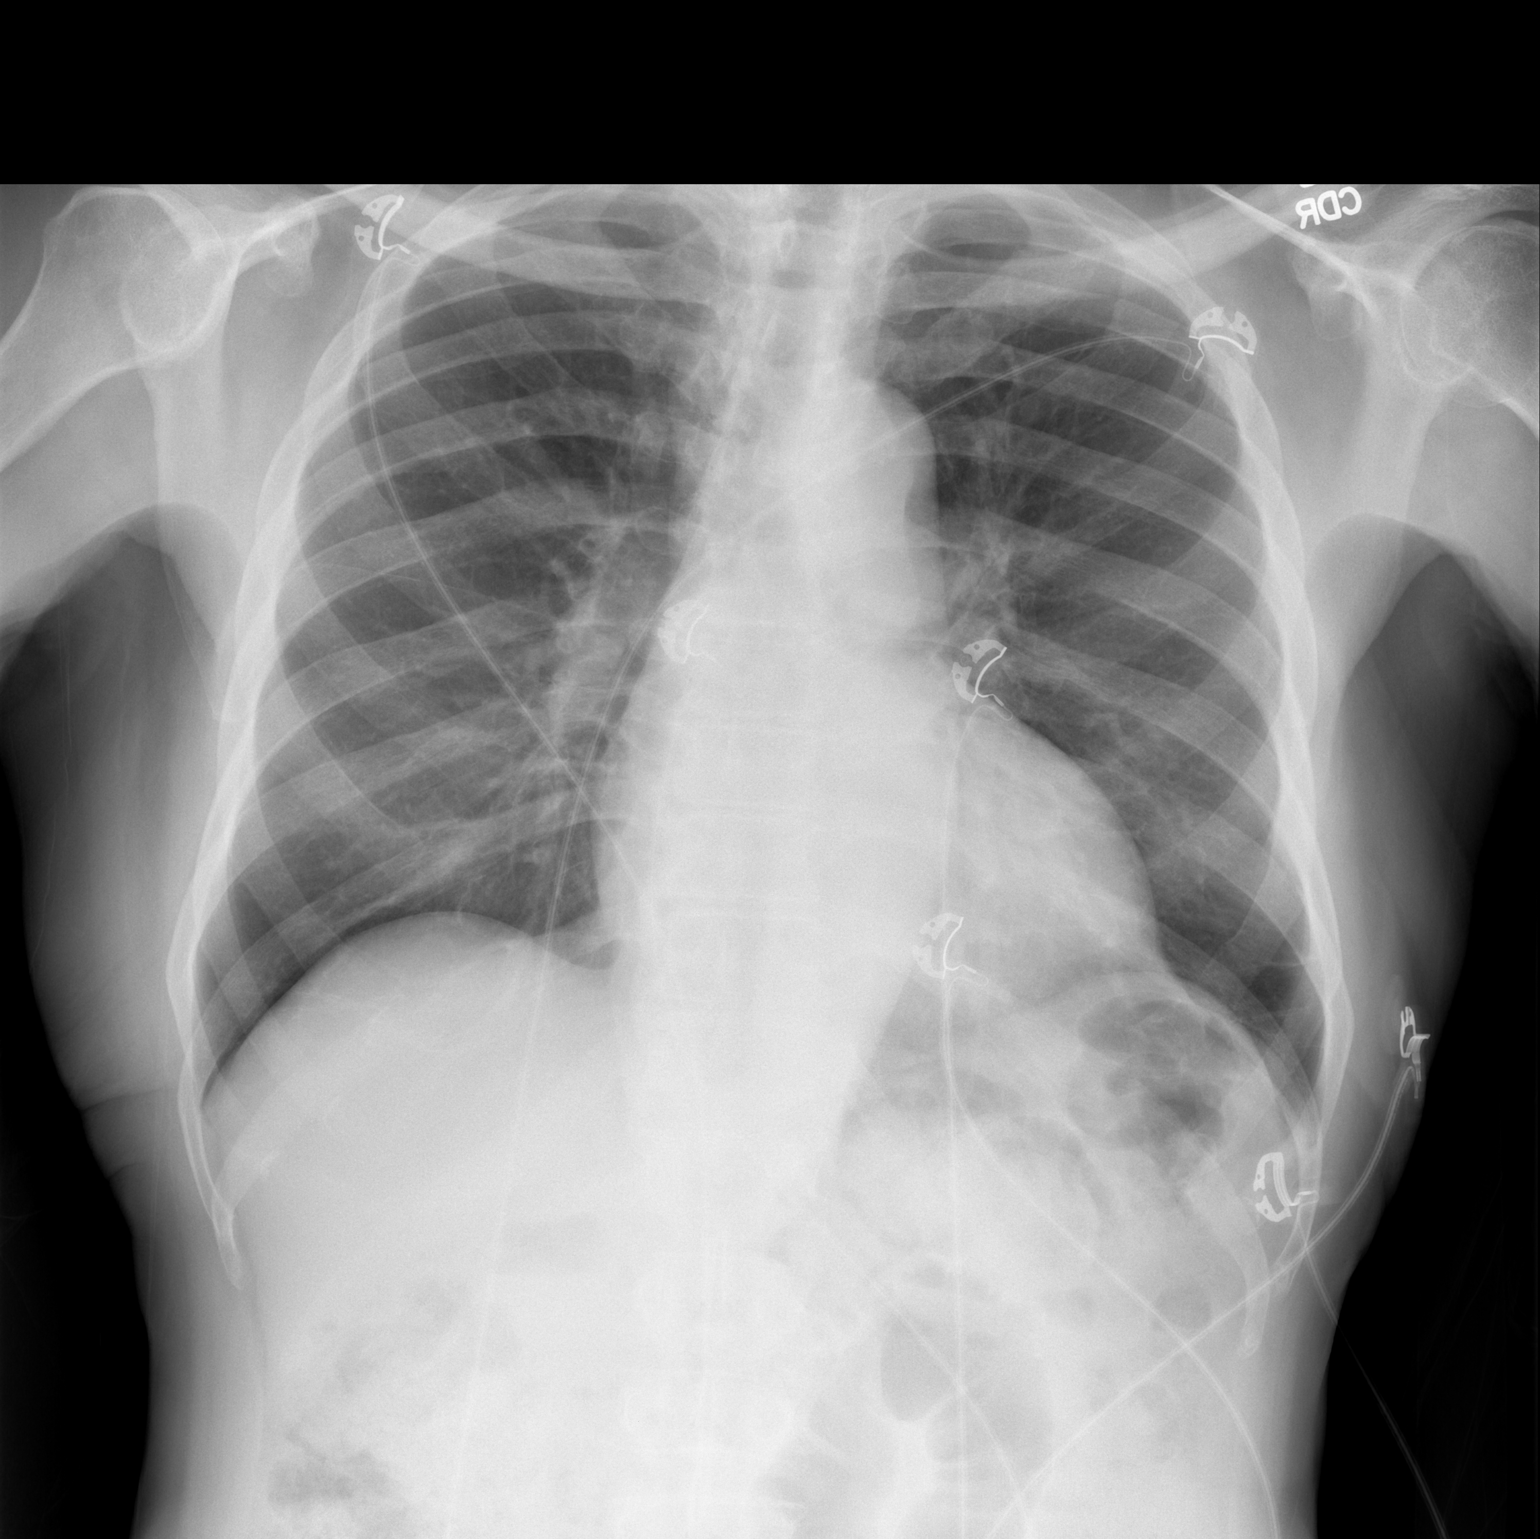

[w chest lat]
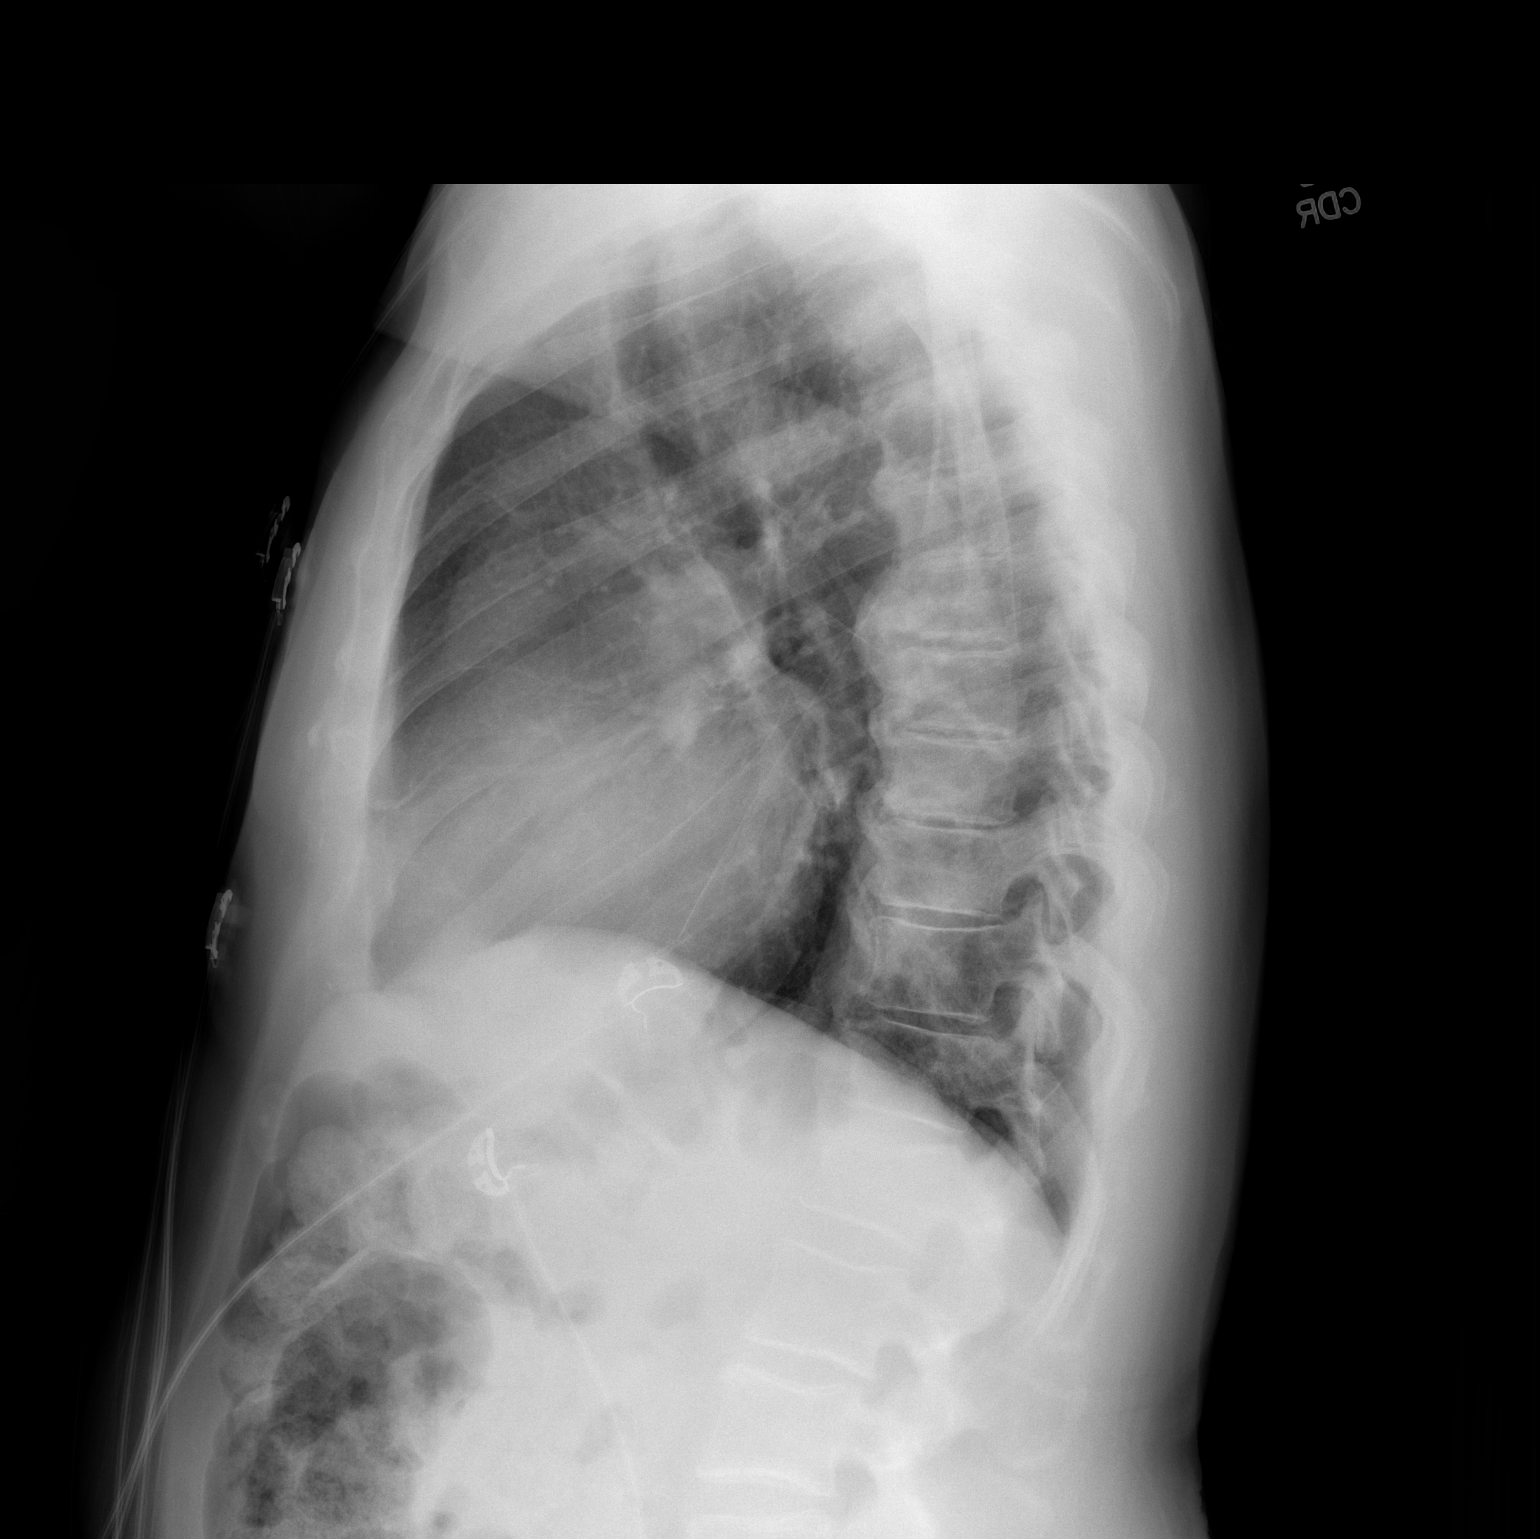

[2 of 2 positions shown; findings below may reference images not displayed]

FINDINGS: No focal pulmonary infiltrate or effusion. Heart size upper normal.
No pneumothorax. Degenerative osteophytes of the spine.
IMPRESSION: No active cardiopulmonary disease.

## 2018-12-11 IMAGING — US US EXTREM LOW VENOUS BILAT
1 series · 13 of 24 positions shown · non-contrast
Comparison: None.

CLINICAL DATA: Bilateral lower extremity edema for 3 days



[Series 1: us extrem low venous bilat · 0.08mm/px · 13 of 55 slices shown]
[im 1/55]
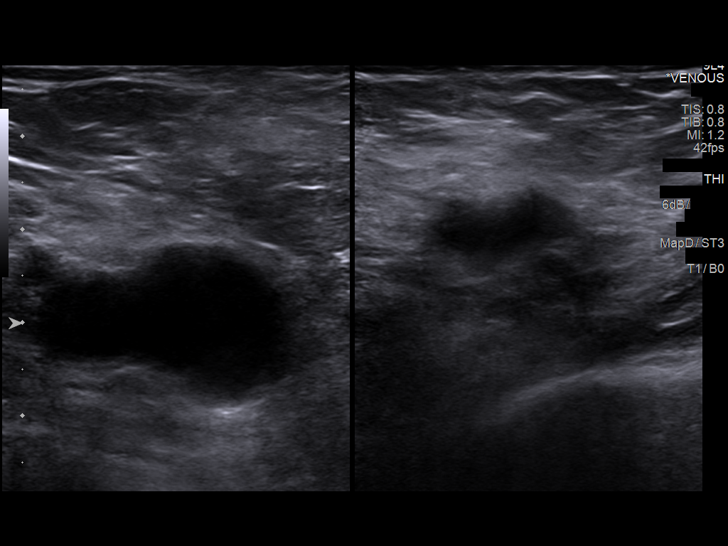
[im 5/55]
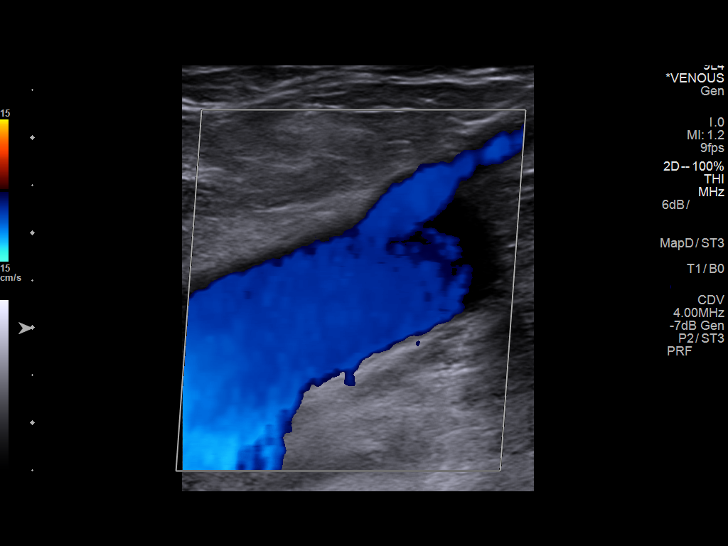
[im 10/55]
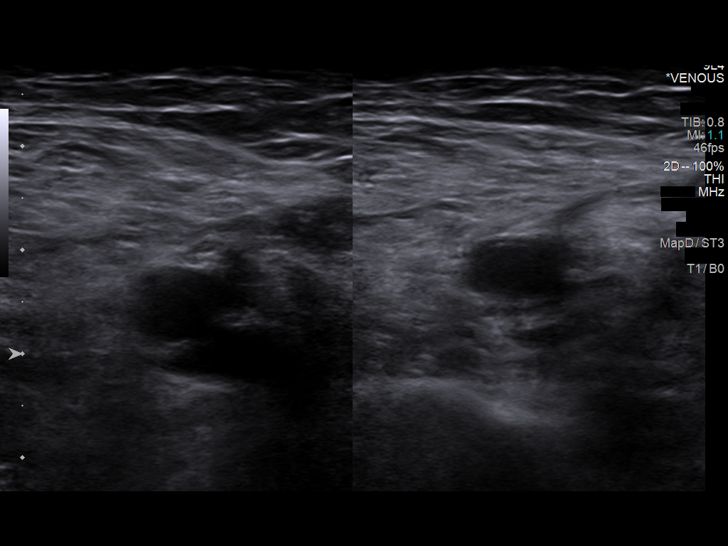
[im 15/55]
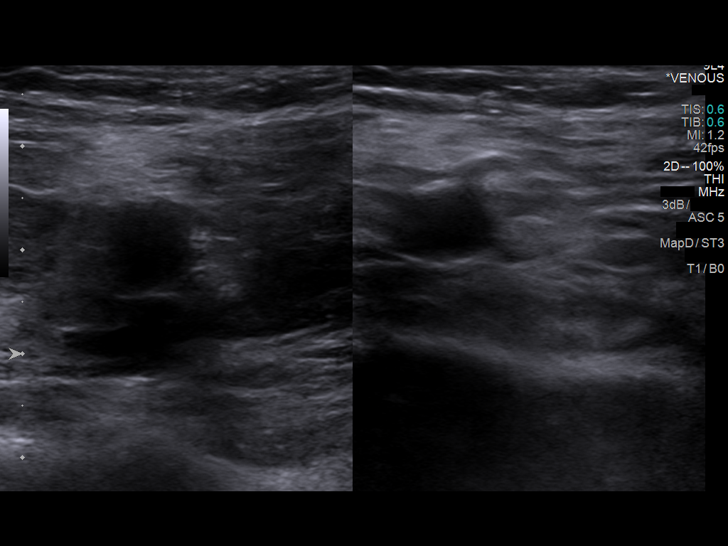
[im 19/55]
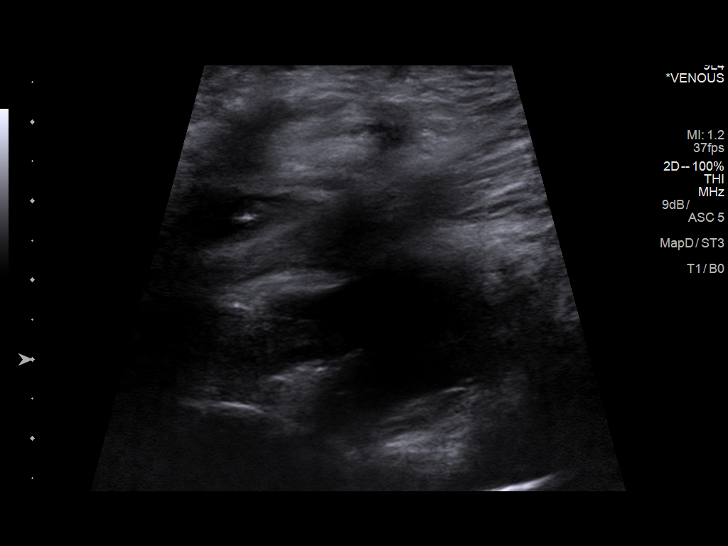
[im 24/55]
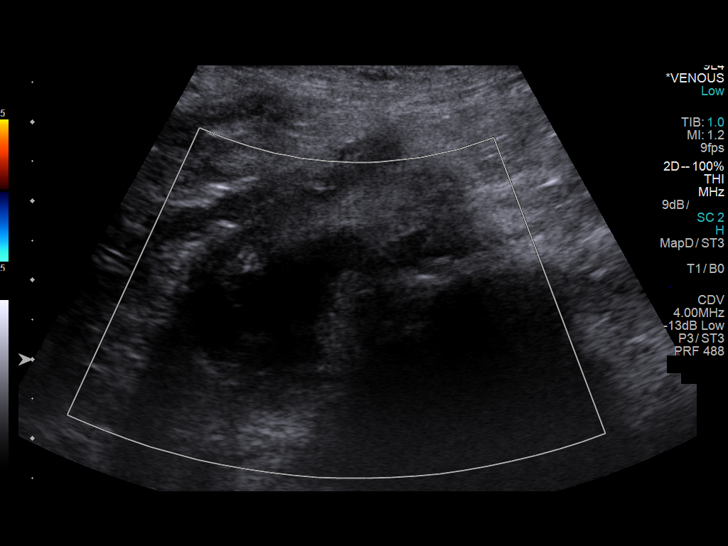
[im 29/55]
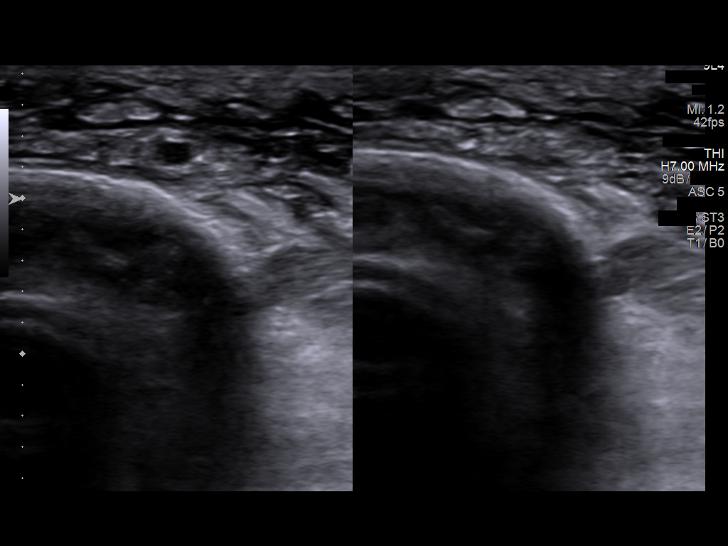
[im 31/55]
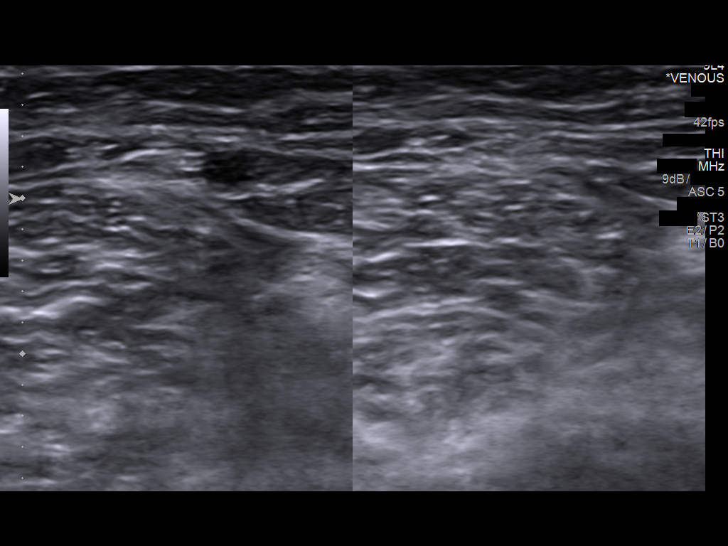
[im 36/55]
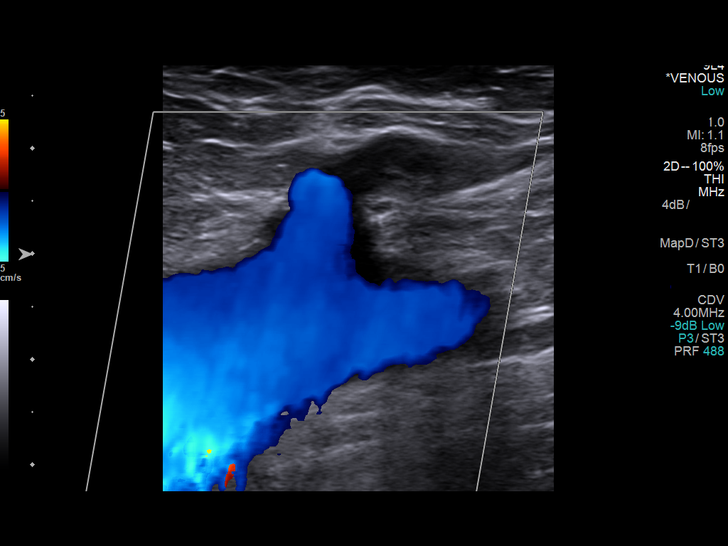
[im 40/55]
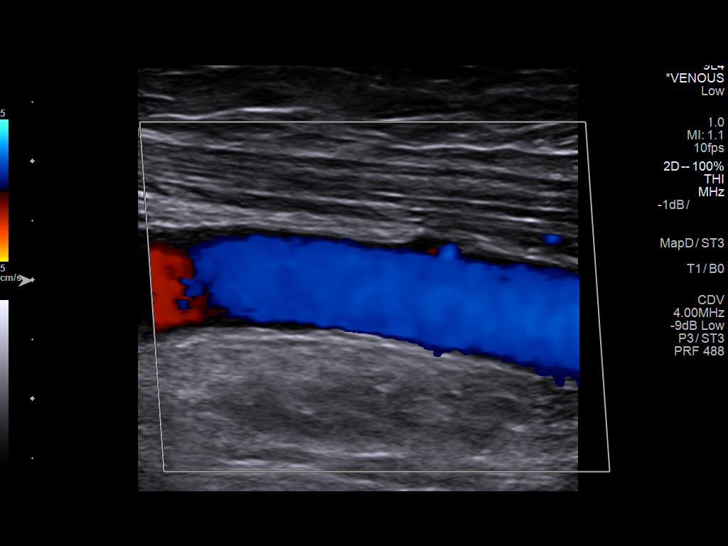
[im 45/55]
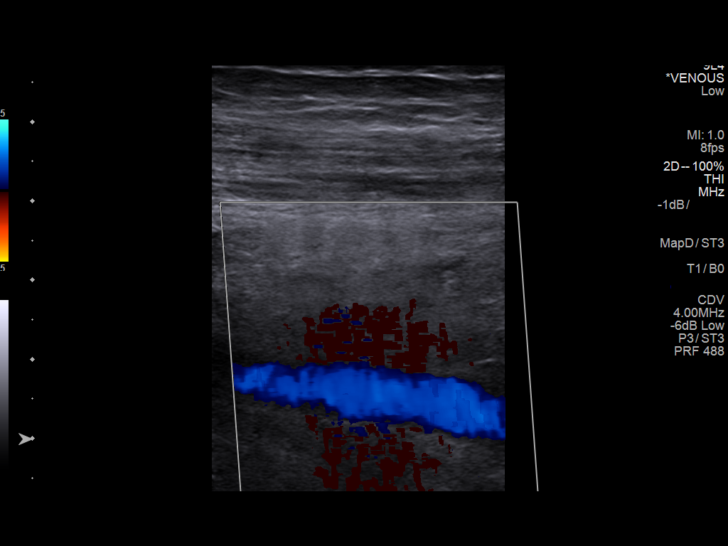
[im 50/55]
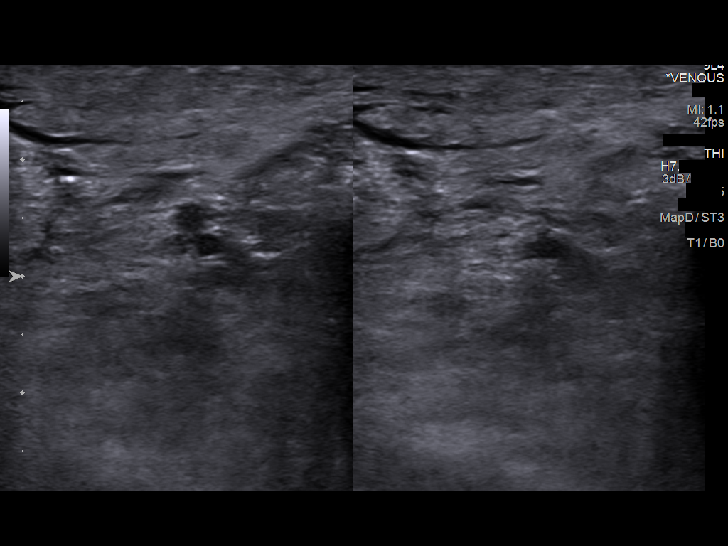
[im 55/55]
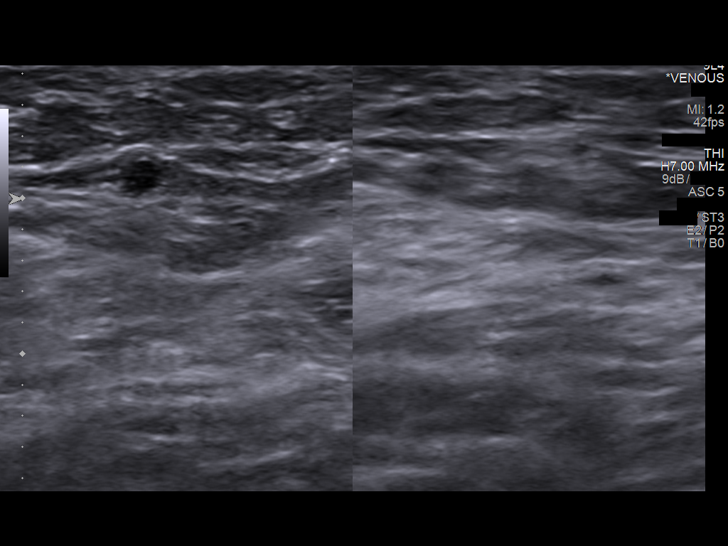

[13 of 24 positions shown; findings below may reference images not displayed]

FINDINGS: RIGHT LOWER EXTREMITY

Common Femoral Vein: No evidence of thrombus. Normal
compressibility, respiratory phasicity and response to augmentation.

Saphenofemoral Junction: No evidence of thrombus. Normal
compressibility and flow on color Doppler imaging.

Profunda Femoral Vein: No evidence of thrombus. Normal
compressibility and flow on color Doppler imaging.

Femoral Vein: No evidence of thrombus. Normal compressibility,
respiratory phasicity and response to augmentation.

Popliteal Vein: No evidence of thrombus. Normal compressibility,
respiratory phasicity and response to augmentation.

Calf Veins: No evidence of thrombus. Normal compressibility and flow
on color Doppler imaging.

Superficial Great Saphenous Vein: No evidence of thrombus. Normal
compressibility and flow on color Doppler imaging.

Other Findings: Slightly complex fluid collection in the right
popliteal fossa measuring 4 x 2.1 x 3.5 cm

LEFT LOWER EXTREMITY

Common Femoral Vein: No evidence of thrombus. Normal
compressibility, respiratory phasicity and response to augmentation.

Saphenofemoral Junction: No evidence of thrombus. Normal
compressibility and flow on color Doppler imaging.

Profunda Femoral Vein: No evidence of thrombus. Normal
compressibility and flow on color Doppler imaging.

Femoral Vein: No evidence of thrombus. Normal compressibility,
respiratory phasicity and response to augmentation.

Popliteal Vein: No evidence of thrombus. Normal compressibility,
respiratory phasicity and response to augmentation.

Calf Veins: No evidence of thrombus. Normal compressibility and flow
on color Doppler imaging.

Superficial Great Saphenous Vein: No evidence of thrombus. Normal
compressibility and flow on color Doppler imaging.

Other Findings:  None.
IMPRESSION: No evidence of DVT within either lower extremity.

4 cm slightly complex fluid collection in the popliteal fossa
suggesting popliteal fossa cyst containing hemorrhage or debris.

## 2019-02-10 DEATH — deceased
